# Patient Record
Sex: Female | Born: 1950 | Race: White | Hispanic: No | Marital: Single
Health system: Southern US, Community
[De-identification: ages and names within clinical notes are randomized; demographics above are authoritative.]

---

## 2012-01-09 HISTORY — PX: BREAST BIOPSY: SHX20

## 2012-10-15 ENCOUNTER — Other Ambulatory Visit: Payer: Self-pay | Admitting: Family Medicine

## 2012-10-15 DIAGNOSIS — Z1231 Encounter for screening mammogram for malignant neoplasm of breast: Secondary | ICD-10-CM

## 2012-10-24 ENCOUNTER — Ambulatory Visit
Admission: RE | Admit: 2012-10-24 | Discharge: 2012-10-24 | Disposition: A | Discharge status: Home / Self Care | Payer: 59 | Source: Ambulatory Visit | Attending: Family Medicine | Admitting: Family Medicine

## 2012-10-24 DIAGNOSIS — Z1231 Encounter for screening mammogram for malignant neoplasm of breast: Secondary | ICD-10-CM

## 2012-10-30 ENCOUNTER — Other Ambulatory Visit: Payer: Self-pay | Admitting: Family Medicine

## 2012-10-30 DIAGNOSIS — R928 Other abnormal and inconclusive findings on diagnostic imaging of breast: Secondary | ICD-10-CM

## 2012-11-14 ENCOUNTER — Other Ambulatory Visit: Payer: Self-pay | Admitting: Family Medicine

## 2012-11-14 ENCOUNTER — Ambulatory Visit
Admission: RE | Admit: 2012-11-14 | Discharge: 2012-11-14 | Disposition: A | Discharge status: Home / Self Care | Payer: 59 | Source: Ambulatory Visit | Attending: Family Medicine | Admitting: Family Medicine

## 2012-11-14 DIAGNOSIS — R928 Other abnormal and inconclusive findings on diagnostic imaging of breast: Secondary | ICD-10-CM

## 2012-11-17 ENCOUNTER — Ambulatory Visit
Admission: RE | Admit: 2012-11-17 | Discharge: 2012-11-17 | Disposition: A | Discharge status: Home / Self Care | Payer: 59 | Source: Ambulatory Visit | Attending: Family Medicine | Admitting: Family Medicine

## 2012-11-17 DIAGNOSIS — R928 Other abnormal and inconclusive findings on diagnostic imaging of breast: Secondary | ICD-10-CM

## 2012-11-27 ENCOUNTER — Inpatient Hospital Stay: Admission: RE | Admit: 2012-11-27 | Payer: 59 | Source: Ambulatory Visit

## 2013-10-26 ENCOUNTER — Other Ambulatory Visit: Payer: Self-pay

## 2013-10-26 DIAGNOSIS — Z1239 Encounter for other screening for malignant neoplasm of breast: Secondary | ICD-10-CM

## 2013-10-29 ENCOUNTER — Other Ambulatory Visit: Payer: Self-pay

## 2013-10-29 DIAGNOSIS — Z1231 Encounter for screening mammogram for malignant neoplasm of breast: Secondary | ICD-10-CM

## 2013-11-06 ENCOUNTER — Ambulatory Visit
Admission: RE | Admit: 2013-11-06 | Discharge: 2013-11-06 | Disposition: A | Discharge status: Home / Self Care | Payer: 59 | Source: Ambulatory Visit

## 2013-11-06 ENCOUNTER — Encounter (INDEPENDENT_AMBULATORY_CARE_PROVIDER_SITE_OTHER): Payer: Self-pay

## 2013-11-06 DIAGNOSIS — Z1231 Encounter for screening mammogram for malignant neoplasm of breast: Secondary | ICD-10-CM

## 2014-10-25 ENCOUNTER — Other Ambulatory Visit: Payer: Self-pay

## 2014-10-25 DIAGNOSIS — Z1231 Encounter for screening mammogram for malignant neoplasm of breast: Secondary | ICD-10-CM

## 2014-11-10 ENCOUNTER — Ambulatory Visit
Admission: RE | Admit: 2014-11-10 | Discharge: 2014-11-10 | Disposition: A | Discharge status: Home / Self Care | Payer: 59 | Source: Ambulatory Visit

## 2014-11-10 DIAGNOSIS — Z1231 Encounter for screening mammogram for malignant neoplasm of breast: Secondary | ICD-10-CM

## 2015-10-11 ENCOUNTER — Other Ambulatory Visit: Payer: Self-pay | Admitting: Family Medicine

## 2015-10-11 DIAGNOSIS — Z1231 Encounter for screening mammogram for malignant neoplasm of breast: Secondary | ICD-10-CM

## 2015-11-11 ENCOUNTER — Ambulatory Visit
Admission: RE | Admit: 2015-11-11 | Discharge: 2015-11-11 | Disposition: A | Discharge status: Home / Self Care | Payer: BLUE CROSS/BLUE SHIELD | Source: Ambulatory Visit | Attending: Family Medicine | Admitting: Family Medicine

## 2015-11-11 DIAGNOSIS — Z1231 Encounter for screening mammogram for malignant neoplasm of breast: Secondary | ICD-10-CM

## 2016-10-02 ENCOUNTER — Other Ambulatory Visit: Payer: Self-pay | Admitting: Family Medicine

## 2016-10-02 DIAGNOSIS — Z1231 Encounter for screening mammogram for malignant neoplasm of breast: Secondary | ICD-10-CM

## 2016-11-12 ENCOUNTER — Encounter (INDEPENDENT_AMBULATORY_CARE_PROVIDER_SITE_OTHER): Payer: Self-pay

## 2016-11-12 ENCOUNTER — Ambulatory Visit
Admission: RE | Admit: 2016-11-12 | Discharge: 2016-11-12 | Disposition: A | Discharge status: Home / Self Care | Payer: BLUE CROSS/BLUE SHIELD | Source: Ambulatory Visit | Attending: Family Medicine | Admitting: Family Medicine

## 2016-11-12 DIAGNOSIS — Z1231 Encounter for screening mammogram for malignant neoplasm of breast: Secondary | ICD-10-CM

## 2017-09-30 ENCOUNTER — Other Ambulatory Visit: Payer: Self-pay | Admitting: Family Medicine

## 2017-09-30 DIAGNOSIS — Z1231 Encounter for screening mammogram for malignant neoplasm of breast: Secondary | ICD-10-CM

## 2017-11-14 ENCOUNTER — Ambulatory Visit
Admission: RE | Admit: 2017-11-14 | Discharge: 2017-11-14 | Disposition: A | Discharge status: Home / Self Care | Payer: BLUE CROSS/BLUE SHIELD | Source: Ambulatory Visit | Attending: Family Medicine | Admitting: Family Medicine

## 2017-11-14 DIAGNOSIS — Z1231 Encounter for screening mammogram for malignant neoplasm of breast: Secondary | ICD-10-CM

## 2018-10-06 ENCOUNTER — Other Ambulatory Visit: Payer: Self-pay | Admitting: Family Medicine

## 2018-10-06 DIAGNOSIS — Z1231 Encounter for screening mammogram for malignant neoplasm of breast: Secondary | ICD-10-CM

## 2018-11-19 ENCOUNTER — Other Ambulatory Visit: Payer: Self-pay

## 2018-11-19 ENCOUNTER — Ambulatory Visit
Admission: RE | Admit: 2018-11-19 | Discharge: 2018-11-19 | Disposition: A | Discharge status: Home / Self Care | Payer: Medicare HMO | Source: Ambulatory Visit | Attending: Family Medicine | Admitting: Family Medicine

## 2018-11-19 DIAGNOSIS — Z1231 Encounter for screening mammogram for malignant neoplasm of breast: Secondary | ICD-10-CM

## 2019-02-08 ENCOUNTER — Ambulatory Visit: Payer: Medicare HMO

## 2019-02-14 ENCOUNTER — Ambulatory Visit: Payer: Medicare HMO | Attending: Internal Medicine

## 2019-02-14 DIAGNOSIS — Z23 Encounter for immunization: Secondary | ICD-10-CM

## 2019-02-14 NOTE — Progress Notes (Signed)
   Covid-19 Vaccination Clinic  Name:  Lindsay Arias    MRN: [id number] DOB: 1950/12/13  02/14/2019  Lindsay Arias was observed post Covid-19 immunization for 30 minutes based on pre-vaccination screening without incidence. She was provided with Vaccine Information Sheet and instruction to access the V-Safe system.   Lindsay Arias was instructed to call 911 with any severe reactions post vaccine: Marland Kitchen Difficulty breathing  . Swelling of your face and throat  . A fast heartbeat  . A bad rash all over your body  . Dizziness and weakness    Immunizations Administered    Name Date Dose VIS Date Route   Pfizer COVID-19 Vaccine 02/14/2019  2:37 PM 0.3 mL 12/19/2018 Intramuscular   Manufacturer: [employer], [employer]   Lot: [id number]   NDC: [address]-8

## 2019-03-11 ENCOUNTER — Ambulatory Visit: Payer: Medicare HMO | Attending: Internal Medicine

## 2019-03-11 DIAGNOSIS — Z23 Encounter for immunization: Secondary | ICD-10-CM

## 2019-03-11 NOTE — Progress Notes (Signed)
   Covid-19 Vaccination Clinic  Name:  Lindsay Arias    MRN: [id number] DOB: 1950-09-06  03/11/2019  Lindsay Arias was observed post Covid-19 immunization for 15 minutes without incident. She was provided with Vaccine Information Sheet and instruction to access the V-Safe system.   Lindsay Arias was instructed to call 911 with any severe reactions post vaccine: Marland Kitchen Difficulty breathing  . Swelling of face and throat  . A fast heartbeat  . A bad rash all over body  . Dizziness and weakness   Immunizations Administered    Name Date Dose VIS Date Route   Pfizer COVID-19 Vaccine 03/11/2019 10:33 AM 0.3 mL 12/19/2018 Intramuscular   Manufacturer: [employer], [employer]   Lot: [id number]   NDC: [address]-9

## 2019-10-06 ENCOUNTER — Other Ambulatory Visit: Payer: Self-pay | Admitting: Family Medicine

## 2019-10-06 DIAGNOSIS — Z1231 Encounter for screening mammogram for malignant neoplasm of breast: Secondary | ICD-10-CM

## 2019-11-20 ENCOUNTER — Ambulatory Visit
Admission: RE | Admit: 2019-11-20 | Discharge: 2019-11-20 | Disposition: A | Discharge status: Home / Self Care | Payer: Medicare HMO | Source: Ambulatory Visit | Attending: Family Medicine | Admitting: Family Medicine

## 2019-11-20 ENCOUNTER — Other Ambulatory Visit: Payer: Self-pay

## 2019-11-20 DIAGNOSIS — Z1231 Encounter for screening mammogram for malignant neoplasm of breast: Secondary | ICD-10-CM

## 2020-10-12 ENCOUNTER — Other Ambulatory Visit: Payer: Self-pay | Admitting: Family Medicine

## 2020-10-12 DIAGNOSIS — Z1231 Encounter for screening mammogram for malignant neoplasm of breast: Secondary | ICD-10-CM

## 2020-11-21 ENCOUNTER — Ambulatory Visit
Admission: RE | Admit: 2020-11-21 | Discharge: 2020-11-21 | Disposition: A | Discharge status: Home / Self Care | Payer: Medicare HMO | Source: Ambulatory Visit | Attending: Family Medicine | Admitting: Family Medicine

## 2020-11-21 DIAGNOSIS — Z1231 Encounter for screening mammogram for malignant neoplasm of breast: Secondary | ICD-10-CM

## 2021-11-29 ENCOUNTER — Other Ambulatory Visit: Payer: Self-pay | Admitting: Family Medicine

## 2021-11-29 DIAGNOSIS — Z1231 Encounter for screening mammogram for malignant neoplasm of breast: Secondary | ICD-10-CM

## 2022-02-14 ENCOUNTER — Ambulatory Visit
Admission: RE | Admit: 2022-02-14 | Discharge: 2022-02-14 | Disposition: A | Discharge status: Home / Self Care | Payer: Medicare HMO | Source: Ambulatory Visit | Attending: Family Medicine | Admitting: Family Medicine

## 2022-02-14 DIAGNOSIS — Z1231 Encounter for screening mammogram for malignant neoplasm of breast: Secondary | ICD-10-CM

## 2023-01-15 ENCOUNTER — Other Ambulatory Visit: Payer: Self-pay | Admitting: Family Medicine

## 2023-01-15 DIAGNOSIS — Z1231 Encounter for screening mammogram for malignant neoplasm of breast: Secondary | ICD-10-CM

## 2023-02-19 ENCOUNTER — Ambulatory Visit
Admission: RE | Admit: 2023-02-19 | Discharge: 2023-02-19 | Disposition: A | Discharge status: Home / Self Care | Payer: Medicare Other | Source: Ambulatory Visit | Attending: Family Medicine | Admitting: Family Medicine

## 2023-02-19 DIAGNOSIS — Z1231 Encounter for screening mammogram for malignant neoplasm of breast: Secondary | ICD-10-CM

## 2023-02-19 IMAGING — MG MM DIGITAL SCREENING BILAT W/ TOMO AND CAD
6 of 12 series · 6 of 36 positions shown · non-contrast
Comparison: Previous exam(s).

CLINICAL DATA: Screening.

EXAM:
DIGITAL SCREENING BILATERAL MAMMOGRAM WITH TOMOSYNTHESIS AND CAD
TECHNIQUE: Bilateral screening digital craniocaudal and mediolateral oblique
mammograms were obtained. Bilateral screening digital breast
tomosynthesis was performed. The images were evaluated with
computer-aided detection.

[R MLO synth-2D]
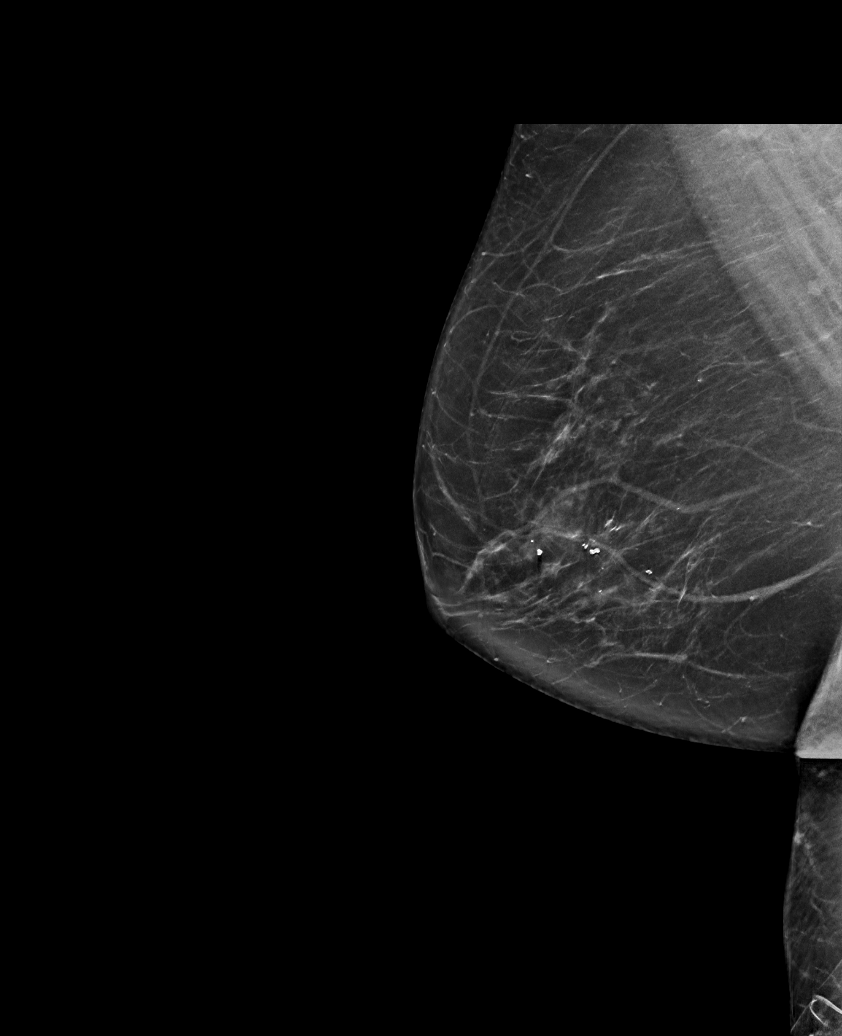

[L MLO synth-2D (1 of 2)]
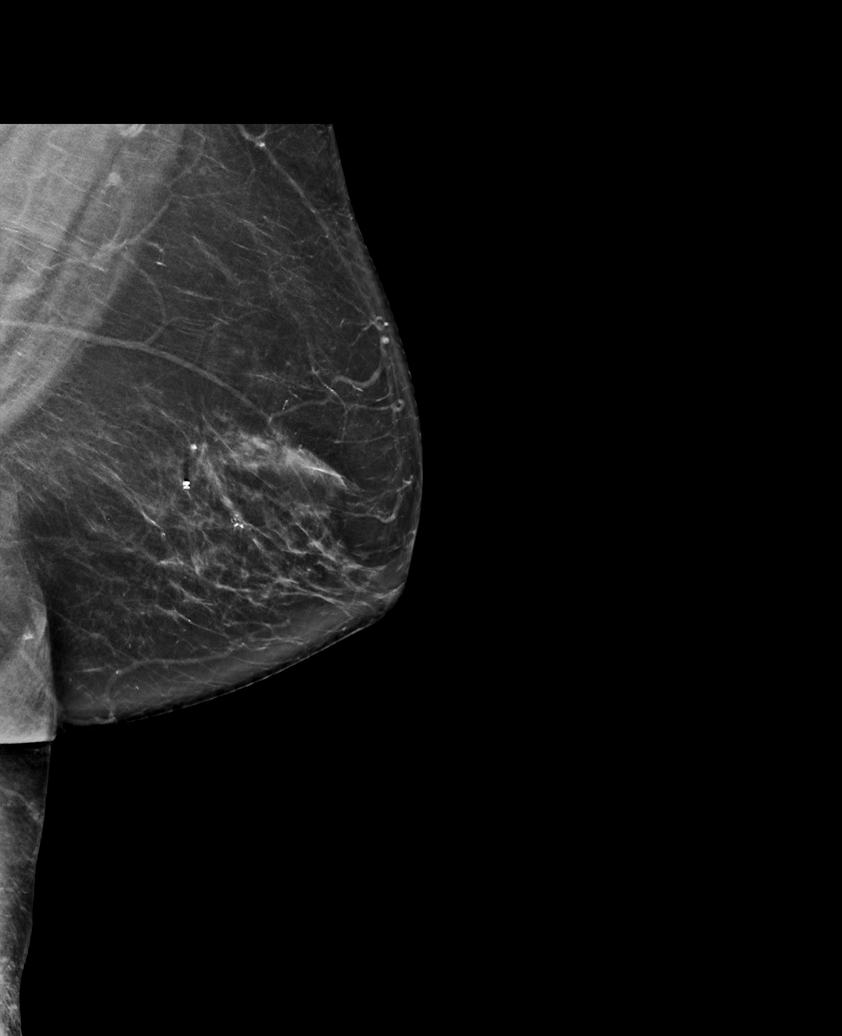

[L CC synth-2D (1 of 2)]
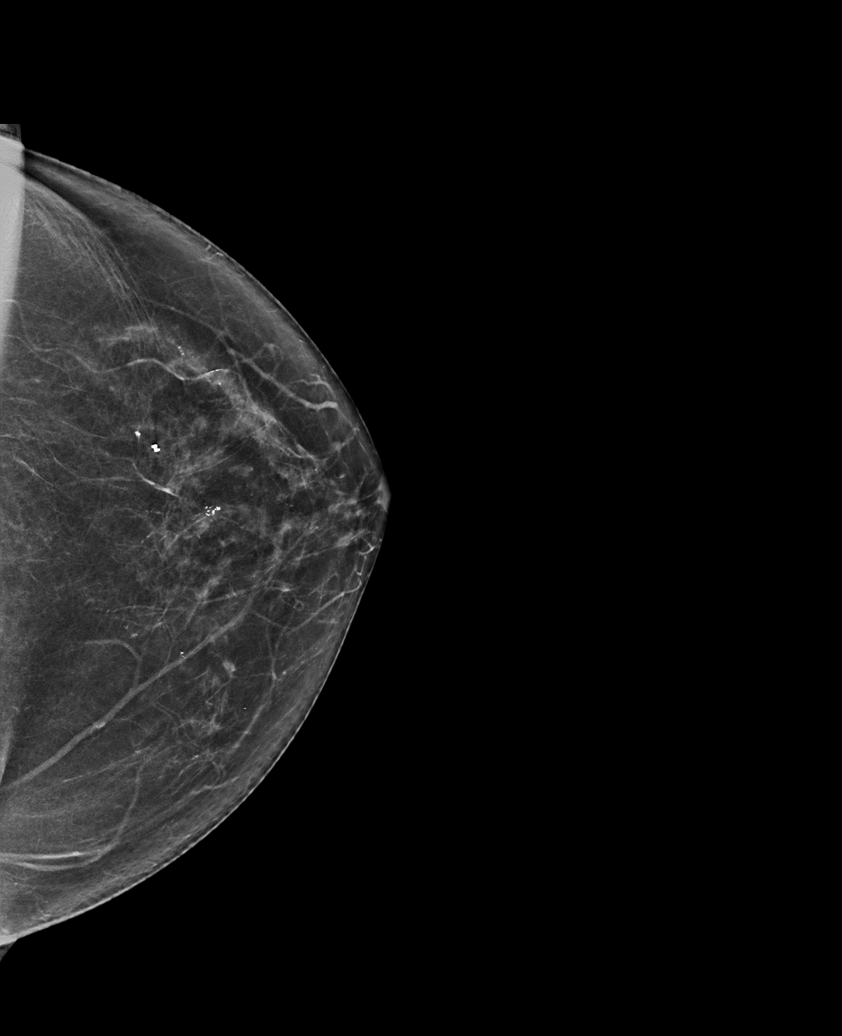

[R CC synth-2D]
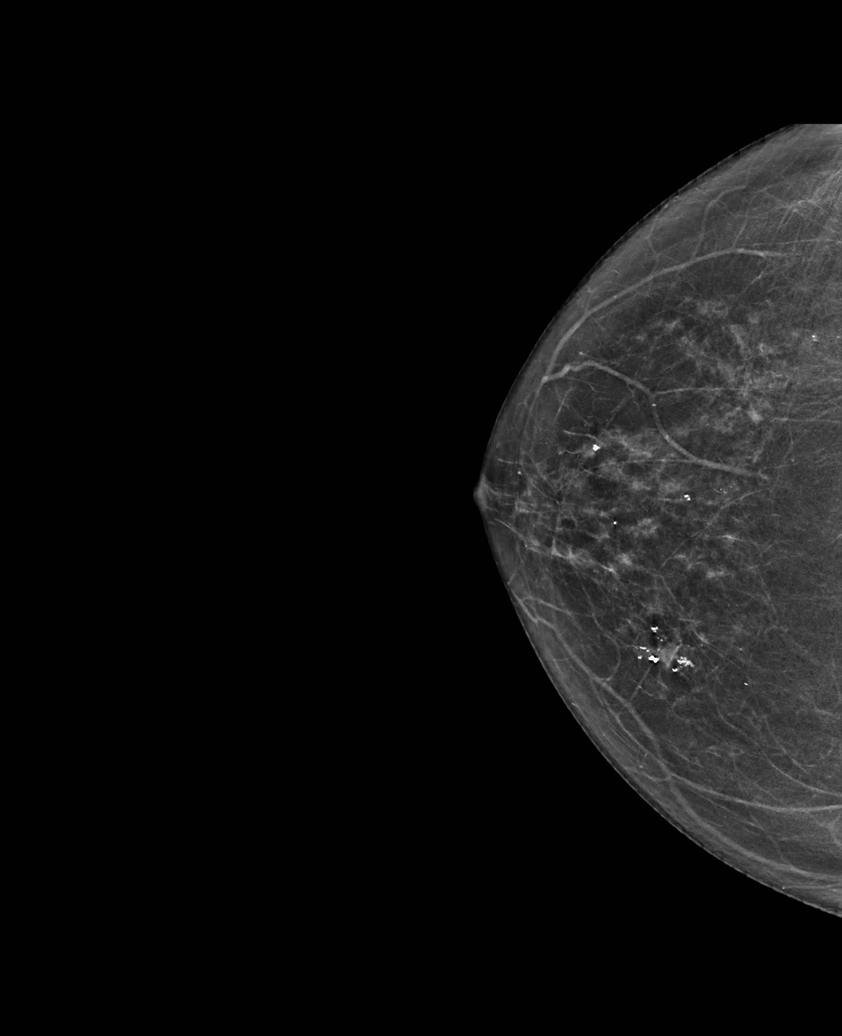

[L CC synth-2D (2 of 2)]
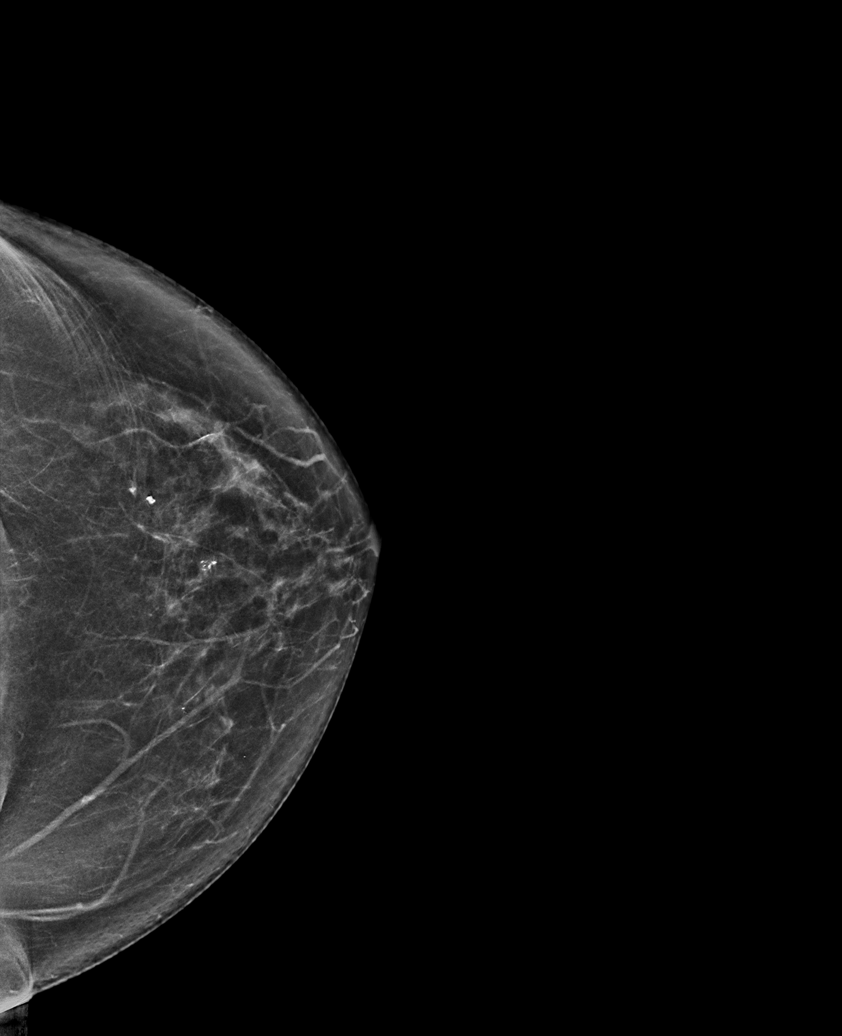

[L MLO synth-2D (2 of 2)]
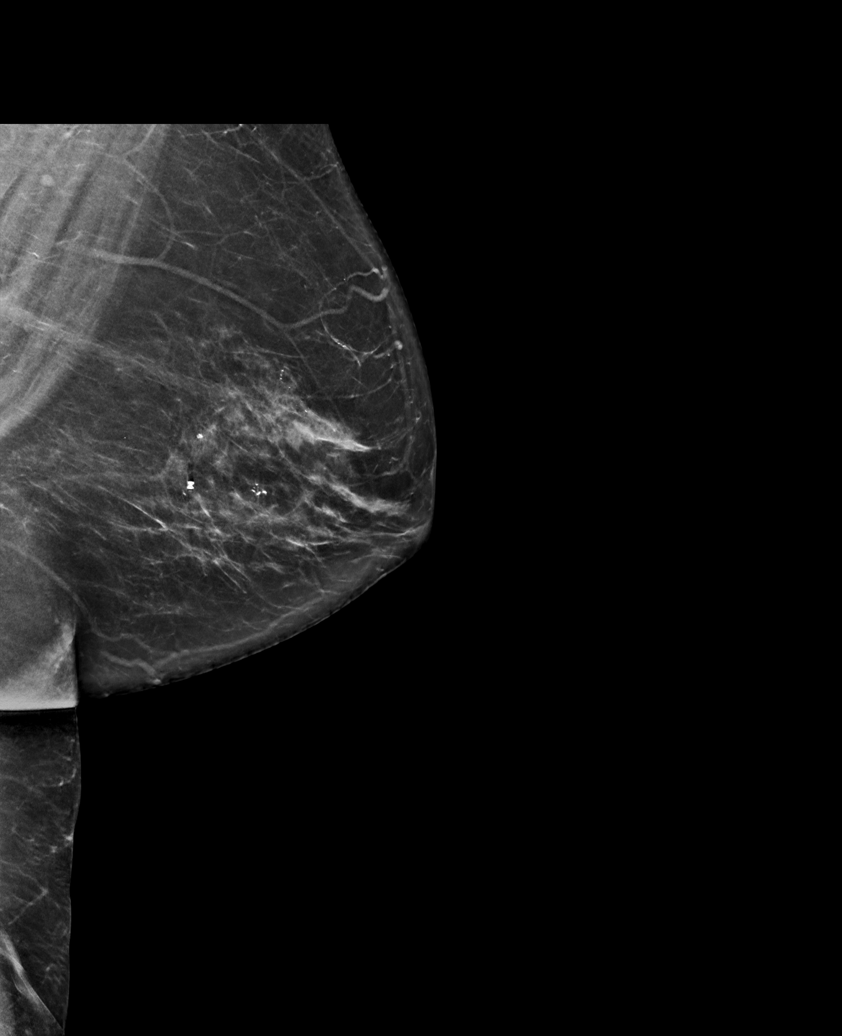

[6 of 36 positions shown; findings below may reference images not displayed]

ACR Breast Density Category b: There are scattered areas of
fibroglandular density.
FINDINGS: There are no findings suspicious for malignancy.
IMPRESSION: No mammographic evidence of malignancy. A result letter of this
screening mammogram will be mailed directly to the patient.

RECOMMENDATION:
Screening mammogram in one year. (Code:51-O-LD2)

BI-RADS CATEGORY  1: Negative.
